# Patient Record
Sex: Female | Born: 1960 | Race: White | Hispanic: No | Smoking: Current some day smoker
Health system: Southern US, Community
[De-identification: ages and names within clinical notes are randomized; demographics above are authoritative.]

## PROBLEM LIST (undated history)

## (undated) DIAGNOSIS — R42 Dizziness and giddiness: Secondary | ICD-10-CM

## (undated) DIAGNOSIS — Z9889 Other specified postprocedural states: Secondary | ICD-10-CM

## (undated) DIAGNOSIS — G4730 Sleep apnea, unspecified: Secondary | ICD-10-CM

## (undated) DIAGNOSIS — T4145XA Adverse effect of unspecified anesthetic, initial encounter: Secondary | ICD-10-CM

## (undated) DIAGNOSIS — R55 Syncope and collapse: Secondary | ICD-10-CM

## (undated) DIAGNOSIS — R112 Nausea with vomiting, unspecified: Secondary | ICD-10-CM

## (undated) DIAGNOSIS — K219 Gastro-esophageal reflux disease without esophagitis: Secondary | ICD-10-CM

## (undated) DIAGNOSIS — T8859XA Other complications of anesthesia, initial encounter: Secondary | ICD-10-CM

## (undated) DIAGNOSIS — I499 Cardiac arrhythmia, unspecified: Secondary | ICD-10-CM

## (undated) DIAGNOSIS — K2270 Barrett's esophagus without dysplasia: Secondary | ICD-10-CM

## (undated) HISTORY — PX: HERNIA REPAIR: SHX51

## (undated) HISTORY — PX: TONSILLECTOMY: SUR1361

## (undated) HISTORY — PX: NISSEN FUNDOPLICATION: SHX2091

## (undated) HISTORY — PX: ABDOMINAL HYSTERECTOMY: SHX81

## (undated) HISTORY — PX: COLONOSCOPY: SHX174

---

## 2009-12-18 ENCOUNTER — Inpatient Hospital Stay: Payer: Self-pay | Admitting: Internal Medicine

## 2010-01-06 ENCOUNTER — Ambulatory Visit: Payer: Self-pay | Admitting: Gastroenterology

## 2011-01-21 IMAGING — CT CT ABD-PELV W/ CM
1 of 2 series · 15 of 32 positions shown, 19 images · non-contrast
Comparison: none

REASON FOR EXAM: generalized abdominal pain and nausea
COMMENTS:   May transport without cardiac monitor

[Series 2: 3mm soft tissue · axial · 0.71mm/px · z∈[-951,-534]mm · 15 of 153 slices shown, 19 images]
[im 7/153  soft-tissue]
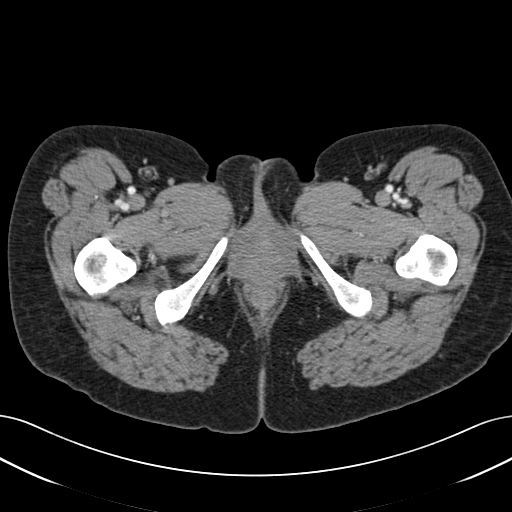
[im 7/153  bone]
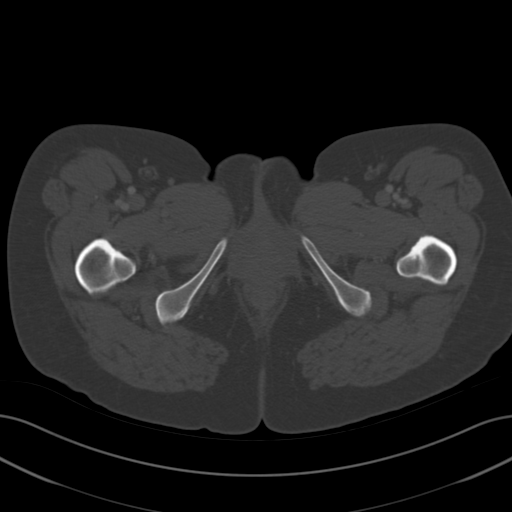
[im 19/153  soft-tissue]
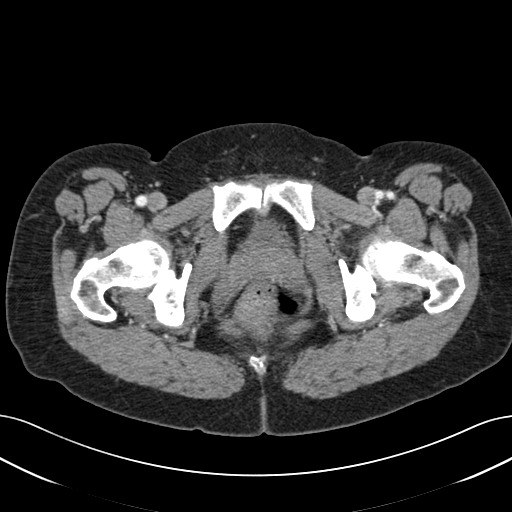
[im 31/153  soft-tissue]
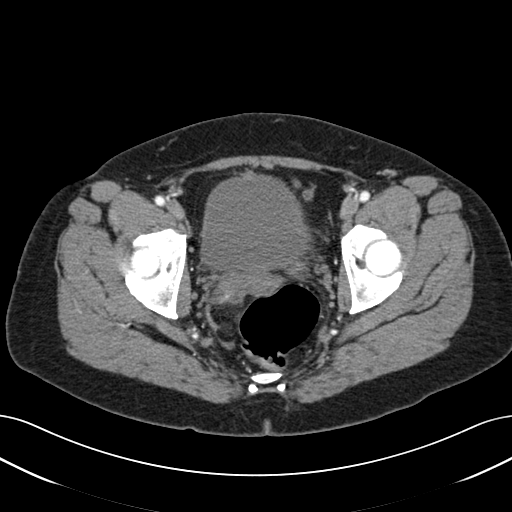
[im 43/153  soft-tissue]
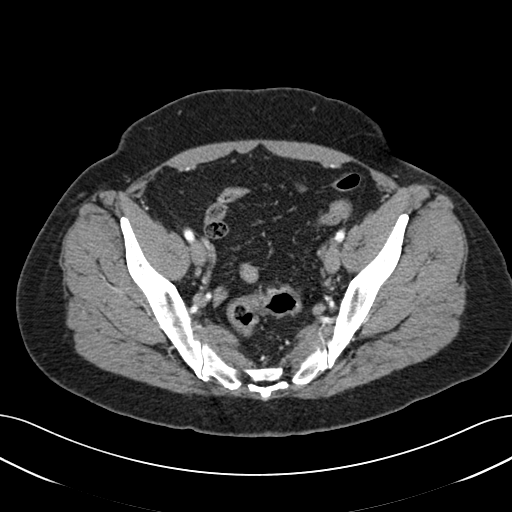
[im 55/153  soft-tissue]
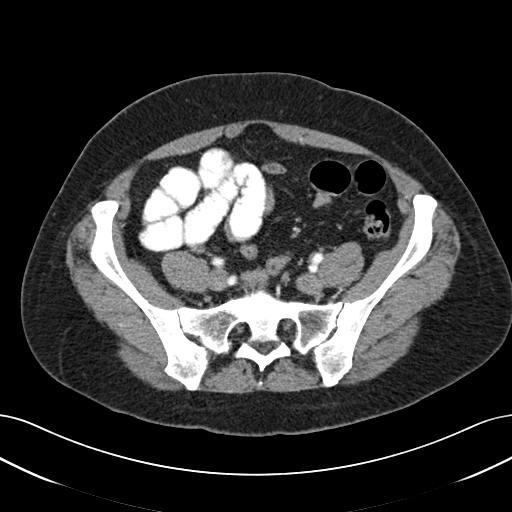
[im 67/153  soft-tissue]
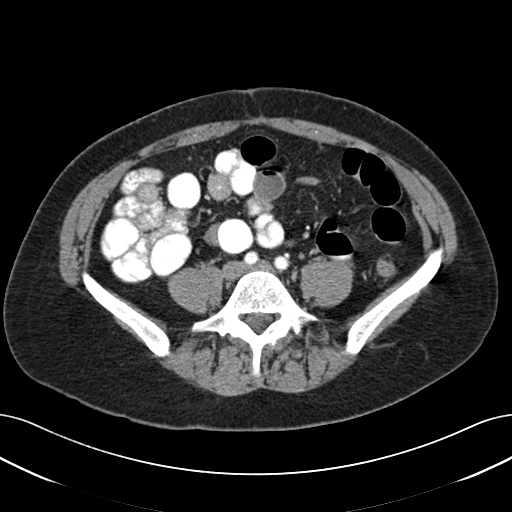
[im 80/153  soft-tissue]
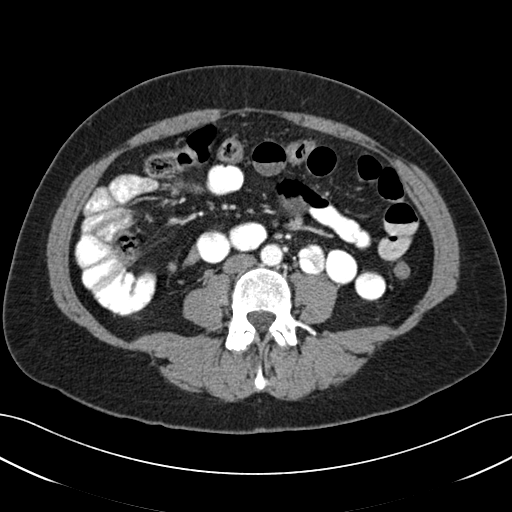
[im 86/153  soft-tissue]
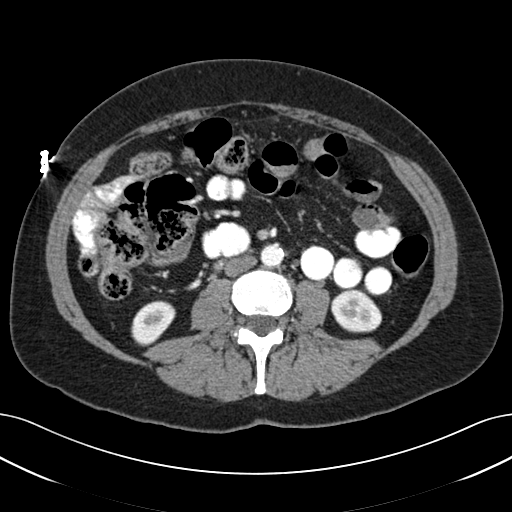
[im 98/153  soft-tissue]
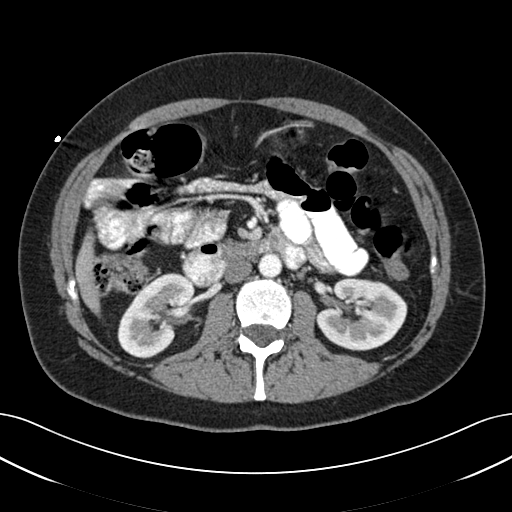
[im 98/153  bone]
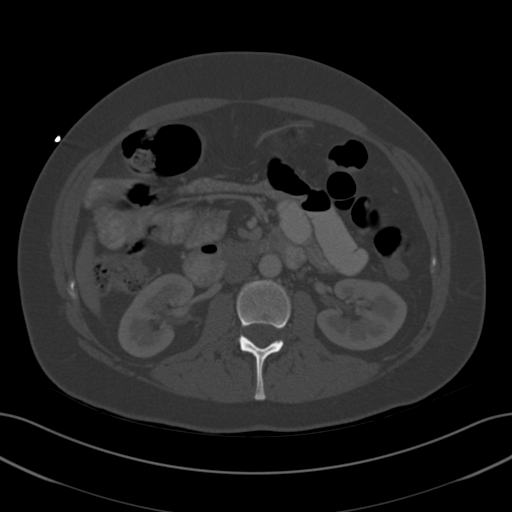
[im 110/153  soft-tissue]
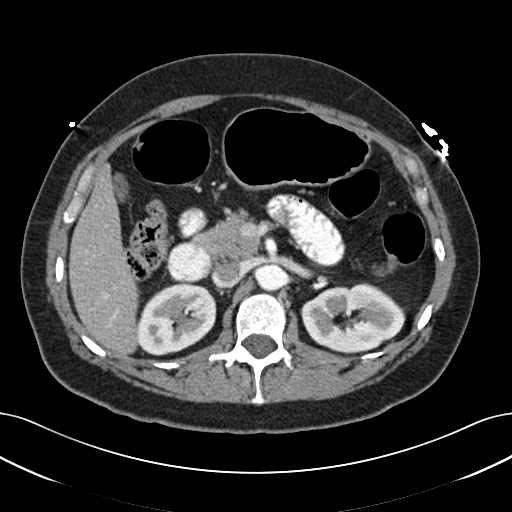
[im 122/153  soft-tissue]
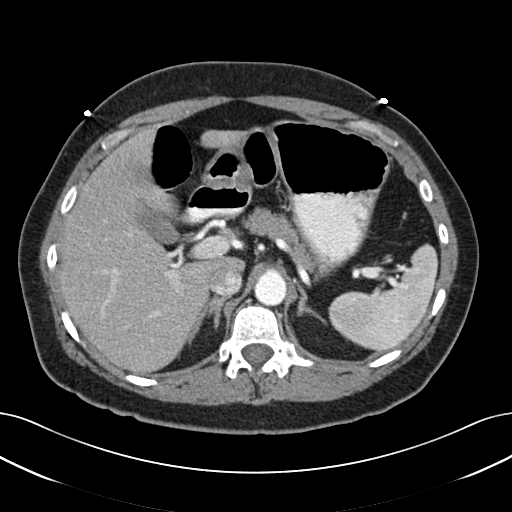
[im 128/153  lung]
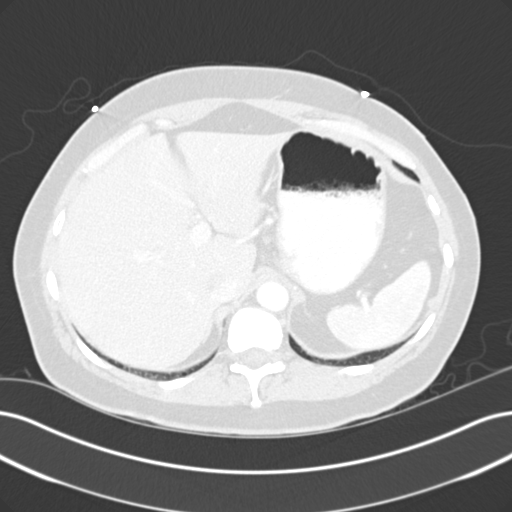
[im 134/153  soft-tissue]
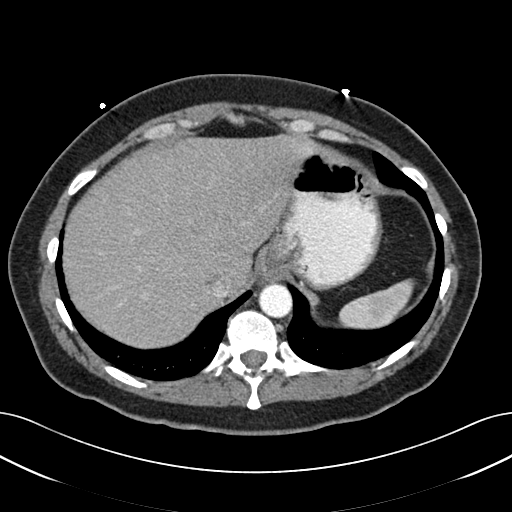
[im 134/153  lung]
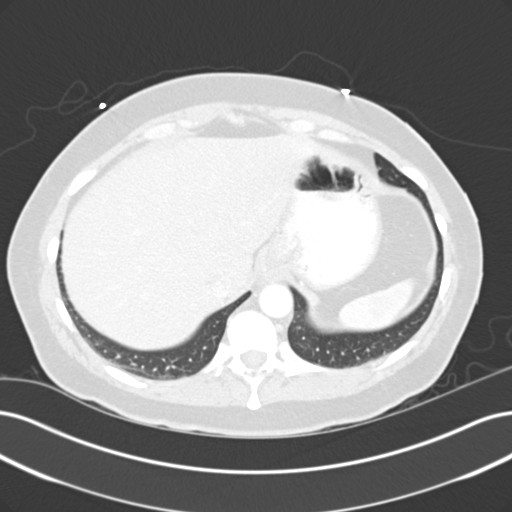
[im 140/153  lung]
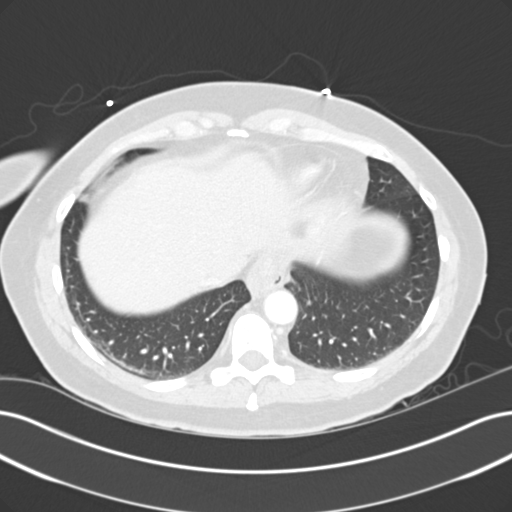
[im 146/153  soft-tissue]
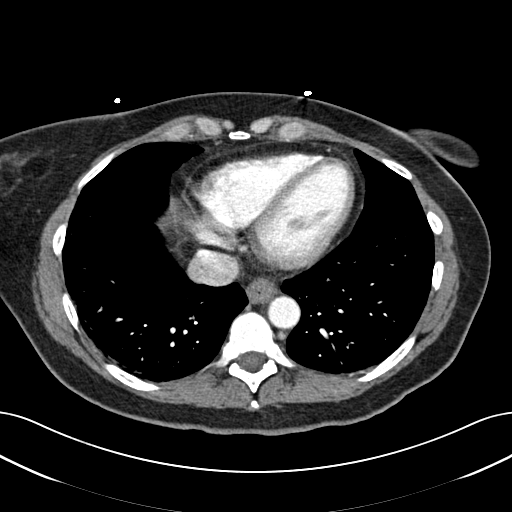
[im 146/153  lung]
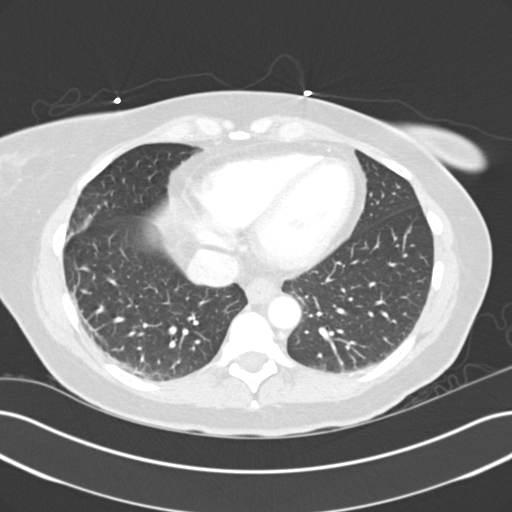

[15 of 32 positions shown; findings below may reference images not displayed]

PROCEDURE:     CT  - CT ABDOMEN / PELVIS  W  - December 18, 2009  [DATE]

RESULT:     Axial CT scanning was performed through the abdomen and pelvis
at 3 mm intervals and slice thicknesses following intravenous administration
of 100 cc of Psovue-FSK as well as oral contrast.

The liver exhibits normal density with no focal mass or ductal dilation. The
gallbladder is adequately distended with no evidence of stones or
inflammatory change in the surrounding fat. The spleen is normal in size.
The stomach is moderately distended. There is a hiatal hernia and there is
some thickening of the distal esophagus. There are no adrenal masses. The
pancreas, kidneys, and abdominal aorta appear normal. The periaortic and
pericaval regions are normal in appearance. The orally administered contrast
has traversed only a portion of the small bowel. The small bowel gas pattern
is nonspecific. The colon is relatively collapsed. The urinary bladder is
normal in appearance. The adnexal structures exhibit no acute abnormality.
There is an approximately 2 cm diameter ovarian cyst on the right. The lung
bases exhibit minimal atelectasis on the right. The lumbar vertebral bodies
are preserved in height.
IMPRESSION: 1. I do not see acute abnormality of the stomach. There is thickening of the
wall of the distal esophagus and there is a small hiatal hernia. Further
evaluation of the patient's history of Barrett's esophagus with upper
endoscopy may be of value.
2. I see no acute hepatobiliary abnormality nor acute urinary tract
abnormality.
3. The bowel gas pattern in the small bowel is nonspecific. The gas and
stool pattern within the colon is within the limits of normal. There is no
evidence of acute appendicitis.

A preliminary report was sent to the [HOSPITAL] the conclusion
of the study.

## 2016-10-12 ENCOUNTER — Encounter: Payer: Self-pay | Admitting: *Deleted

## 2016-10-18 ENCOUNTER — Ambulatory Visit: Payer: BC Managed Care – PPO | Admitting: Anesthesiology

## 2016-10-18 ENCOUNTER — Ambulatory Visit
Admission: RE | Admit: 2016-10-18 | Discharge: 2016-10-18 | Disposition: A | Discharge status: Home / Self Care | Payer: BC Managed Care – PPO | Source: Ambulatory Visit | Attending: Ophthalmology | Admitting: Ophthalmology

## 2016-10-18 ENCOUNTER — Encounter
Admission: RE | Disposition: A | Discharge status: Home / Self Care | Payer: Self-pay | Source: Ambulatory Visit | Attending: Ophthalmology

## 2016-10-18 DIAGNOSIS — G4730 Sleep apnea, unspecified: Secondary | ICD-10-CM | POA: Insufficient documentation

## 2016-10-18 DIAGNOSIS — H2511 Age-related nuclear cataract, right eye: Secondary | ICD-10-CM | POA: Insufficient documentation

## 2016-10-18 DIAGNOSIS — K219 Gastro-esophageal reflux disease without esophagitis: Secondary | ICD-10-CM | POA: Diagnosis not present

## 2016-10-18 HISTORY — DX: Other specified postprocedural states: Z98.890

## 2016-10-18 HISTORY — DX: Gastro-esophageal reflux disease without esophagitis: K21.9

## 2016-10-18 HISTORY — DX: Syncope and collapse: R55

## 2016-10-18 HISTORY — DX: Dizziness and giddiness: R42

## 2016-10-18 HISTORY — DX: Sleep apnea, unspecified: G47.30

## 2016-10-18 HISTORY — DX: Other complications of anesthesia, initial encounter: T88.59XA

## 2016-10-18 HISTORY — DX: Barrett's esophagus without dysplasia: K22.70

## 2016-10-18 HISTORY — DX: Cardiac arrhythmia, unspecified: I49.9

## 2016-10-18 HISTORY — DX: Adverse effect of unspecified anesthetic, initial encounter: T41.45XA

## 2016-10-18 HISTORY — DX: Nausea with vomiting, unspecified: R11.2

## 2016-10-18 HISTORY — PX: CATARACT EXTRACTION W/PHACO: SHX586

## 2016-10-18 SURGERY — PHACOEMULSIFICATION, CATARACT, WITH IOL INSERTION
Anesthesia: Monitor Anesthesia Care | Site: Eye | Laterality: Right | Wound class: Clean

## 2016-10-18 MED ORDER — FENTANYL CITRATE (PF) 100 MCG/2ML IJ SOLN
INTRAMUSCULAR | Status: AC
  Filled 2016-10-18: qty 2

## 2016-10-18 MED ORDER — CARBACHOL 0.01 % IO SOLN
INTRAOCULAR | Status: DC | PRN
  Administered 2016-10-18: .5 mL via INTRAOCULAR

## 2016-10-18 MED ORDER — EPINEPHRINE PF 1 MG/ML IJ SOLN
INTRAOCULAR | Status: DC | PRN
  Administered 2016-10-18: 1 mL via OPHTHALMIC

## 2016-10-18 MED ORDER — ARMC OPHTHALMIC DILATING DROPS
1.0000 "application " | OPHTHALMIC | Status: AC
  Administered 2016-10-18 (×3): 1 via OPHTHALMIC

## 2016-10-18 MED ORDER — MOXIFLOXACIN HCL 0.5 % OP SOLN
1.0000 [drp] | OPHTHALMIC | Status: AC
  Administered 2016-10-18 (×3): 1 [drp] via OPHTHALMIC

## 2016-10-18 MED ORDER — MIDAZOLAM HCL 2 MG/2ML IJ SOLN
INTRAMUSCULAR | Status: DC | PRN
  Administered 2016-10-18 (×2): 1 mg via INTRAVENOUS

## 2016-10-18 MED ORDER — MOXIFLOXACIN HCL 0.5 % OP SOLN
OPHTHALMIC | Status: DC | PRN
  Administered 2016-10-18: 1 [drp] via OPHTHALMIC

## 2016-10-18 MED ORDER — ARMC OPHTHALMIC DILATING DROPS: 1.0000 "application " | OPHTHALMIC | Status: AC

## 2016-10-18 MED ORDER — POVIDONE-IODINE 5 % OP SOLN
OPHTHALMIC | Status: AC
  Filled 2016-10-18: qty 30

## 2016-10-18 MED ORDER — MOXIFLOXACIN HCL 0.5 % OP SOLN
OPHTHALMIC | Status: AC
  Filled 2016-10-18: qty 3

## 2016-10-18 MED ORDER — SODIUM CHLORIDE 0.9 % IV SOLN
INTRAVENOUS | Status: DC
  Administered 2016-10-18: 09:00:00 via INTRAVENOUS

## 2016-10-18 MED ORDER — FENTANYL CITRATE (PF) 100 MCG/2ML IJ SOLN
INTRAMUSCULAR | Status: DC | PRN
  Administered 2016-10-18: 50 ug via INTRAVENOUS

## 2016-10-18 MED ORDER — LIDOCAINE HCL (PF) 4 % IJ SOLN
INTRAMUSCULAR | Status: AC
  Filled 2016-10-18: qty 5

## 2016-10-18 MED ORDER — MOXIFLOXACIN HCL 0.5 % OP SOLN: 1.0000 [drp] | OPHTHALMIC | Status: AC

## 2016-10-18 MED ORDER — LIDOCAINE HCL (PF) 4 % IJ SOLN
INTRAOCULAR | Status: DC | PRN
  Administered 2016-10-18: 2.25 mL via OPHTHALMIC

## 2016-10-18 MED ORDER — NA CHONDROIT SULF-NA HYALURON 40-17 MG/ML IO SOLN
INTRAOCULAR | Status: AC
  Filled 2016-10-18: qty 1

## 2016-10-18 MED ORDER — MIDAZOLAM HCL 2 MG/2ML IJ SOLN
INTRAMUSCULAR | Status: AC
  Filled 2016-10-18: qty 2

## 2016-10-18 MED ORDER — ARMC OPHTHALMIC DILATING DROPS
OPHTHALMIC | Status: AC
  Administered 2016-10-18: 1 via OPHTHALMIC
  Filled 2016-10-18: qty 0.4

## 2016-10-18 MED ORDER — EPINEPHRINE PF 1 MG/ML IJ SOLN
INTRAMUSCULAR | Status: AC
  Filled 2016-10-18: qty 2

## 2016-10-18 MED ORDER — NA CHONDROIT SULF-NA HYALURON 40-17 MG/ML IO SOLN
INTRAOCULAR | Status: DC | PRN
  Administered 2016-10-18: 1 mL via INTRAOCULAR

## 2016-10-18 SURGICAL SUPPLY — 21 items
CANNULA ANT/CHMB 27GA (MISCELLANEOUS) ×3 IMPLANT
CUP MEDICINE 2OZ PLAST GRAD ST (MISCELLANEOUS) ×3 IMPLANT
GLOVE BIO SURGEON STRL SZ8 (GLOVE) ×3 IMPLANT
GLOVE BIOGEL M 6.5 STRL (GLOVE) ×3 IMPLANT
GLOVE SURG LX 8.0 MICRO (GLOVE) ×2
GLOVE SURG LX STRL 8.0 MICRO (GLOVE) ×1 IMPLANT
GOWN STRL REUS W/ TWL LRG LVL3 (GOWN DISPOSABLE) ×2 IMPLANT
GOWN STRL REUS W/TWL LRG LVL3 (GOWN DISPOSABLE) ×4
LENS IOL TECNIS ITEC 18.0 (Intraocular Lens) ×3 IMPLANT
PACK CATARACT (MISCELLANEOUS) ×3 IMPLANT
PACK CATARACT BRASINGTON LX (MISCELLANEOUS) ×3 IMPLANT
PACK EYE AFTER SURG (MISCELLANEOUS) ×3 IMPLANT
SOL BSS BAG (MISCELLANEOUS) ×3
SOL PREP PVP 2OZ (MISCELLANEOUS) ×3
SOLUTION BSS BAG (MISCELLANEOUS) ×1 IMPLANT
SOLUTION PREP PVP 2OZ (MISCELLANEOUS) ×1 IMPLANT
SYR 3ML LL SCALE MARK (SYRINGE) ×3 IMPLANT
SYR 5ML LL (SYRINGE) ×3 IMPLANT
SYR TB 1ML 27GX1/2 LL (SYRINGE) ×3 IMPLANT
WATER STERILE IRR 250ML POUR (IV SOLUTION) ×3 IMPLANT
WIPE NON LINTING 3.25X3.25 (MISCELLANEOUS) ×3 IMPLANT

## 2016-10-18 NOTE — Discharge Instructions (Signed)
Eye Surgery Discharge Instructions  Expect mild scratchy sensation or mild soreness. DO NOT RUB YOUR EYE!  The day of surgery:  Minimal physical activity, but bed rest is not required  No reading, computer work, or close hand work  No bending, lifting, or straining.  May watch TV  For 24 hours:  No driving, legal decisions, or alcoholic beverages  Safety precautions  Eat anything you prefer: It is better to start with liquids, then soup then solid foods.  _____ Eye patch should be worn until postoperative exam tomorrow.  ____ Solar shield eyeglasses should be worn for comfort in the sunlight/patch while sleeping  Resume all regular medications including aspirin or Coumadin if these were discontinued prior to surgery. You may shower, bathe, shave, or wash your hair. Tylenol may be taken for mild discomfort.  Call your doctor if you experience significant pain, nausea, or vomiting, fever > 101 or other signs of infection. [phone] or [phone] Specific instructions:  Follow-up Information    PORFILIO,WILLIAM LOUIS, MD Follow up.   Specialty:  Ophthalmology Why:  January 17 at 10:00am Contact information: [address] [place] [address] [phone]

## 2016-10-18 NOTE — H&P (Signed)
All labs reviewed. Abnormal studies sent to patients PCP when indicated.  Previous H&P reviewed, patient examined, there are NO CHANGES.  Jaqualin Serpa LOUIS1/16/201810:19 AM

## 2016-10-18 NOTE — Op Note (Signed)
PREOPERATIVE DIAGNOSIS:  Nuclear sclerotic cataract of the right eye.   POSTOPERATIVE DIAGNOSIS:  nuclear sclerotic cataract right eye   OPERATIVE PROCEDURE: Procedure(s): CATARACT EXTRACTION PHACO AND INTRAOCULAR LENS PLACEMENT (IOC)   SURGEON:  Galen ManilaWilliam Shanique Aslinger, MD.   ANESTHESIA:  Anesthesiologist: Lenard SimmerAndrew Karenz, MD CRNA: Marlana SalvageSandra Jessup, CRNA  1.      Managed anesthesia care. 2.      0.281ml of Shugarcaine was instilled in the eye following the paracentesis.   COMPLICATIONS:  None.   TECHNIQUE:   Stop and chop   DESCRIPTION OF PROCEDURE:  The patient was examined and consented in the preoperative holding area where the aforementioned topical anesthesia was applied to the right eye and then brought back to the Operating Room where the right eye was prepped and draped in the usual sterile ophthalmic fashion and a lid speculum was placed. A paracentesis was created with the side port blade and the anterior chamber was filled with viscoelastic. A near clear corneal incision was performed with the steel keratome. A continuous curvilinear capsulorrhexis was performed with a cystotome followed by the capsulorrhexis forceps. Hydrodissection and hydrodelineation were carried out with BSS on a blunt cannula. The lens was removed in a stop and chop  technique and the remaining cortical material was removed with the irrigation-aspiration handpiece. The capsular bag was inflated with viscoelastic and the Technis ZCB00  lens was placed in the capsular bag without complication. The remaining viscoelastic was removed from the eye with the irrigation-aspiration handpiece. The wounds were hydrated. The anterior chamber was flushed with Miostat and the eye was inflated to physiologic pressure. 0.91ml of Vigamox was placed in the anterior chamber. The wounds were found to be water tight. The eye was dressed with Vigamox. The patient was given protective glasses to wear throughout the day and a shield with which to  sleep tonight. The patient was also given drops with which to begin a drop regimen today and will follow-up with me in one day.  Implant Name Type Inv. Item Serial No. Manufacturer Lot No. LRB No. Used  LENS IOL DIOP 18.0 - [id number] Intraocular Lens LENS IOL DIOP 18.0 [id number] AMO   Right 1   Procedure(s) with comments: CATARACT EXTRACTION PHACO AND INTRAOCULAR LENS PLACEMENT (IOC) (Right) - US 00:32 AP% 19.2 CDE 6.20 Fluid pack lot # [id number]  Electronically signed: Taurus Alamo LOUIS 10/18/2016 10:45 AM

## 2016-10-18 NOTE — Transfer of Care (Signed)
Immediate Anesthesia Transfer of Care Note  Patient: Olivia Briggs  Procedure(s) Performed: Procedure(s) with comments: CATARACT EXTRACTION PHACO AND INTRAOCULAR LENS PLACEMENT (IOC) (Right) - [place] 00:32 AP% 19.2 CDE 6.20 Fluid pack lot # [id number]  Patient Location: PACU  Anesthesia Type:MAC  Level of Consciousness: awake, alert  and oriented  Airway & Oxygen Therapy: Patient Spontanous Breathing  Post-op Assessment: Report given to RN  Post vital signs: Reviewed and stable  Last Vitals:  Vitals:   10/18/16 1043 10/18/16 1048  BP: 113/71 113/71  Pulse: (!) 57 (!) 58  Resp: 16 16  Temp: 36.1 C     Last Pain:  Vitals:   10/18/16 1048  TempSrc: Tympanic         Complications: No apparent anesthesia complications

## 2016-10-18 NOTE — Anesthesia Postprocedure Evaluation (Signed)
Anesthesia Post Note  Patient: Olivia Briggs  Procedure(s) Performed: Procedure(s) (LRB): CATARACT EXTRACTION PHACO AND INTRAOCULAR LENS PLACEMENT (IOC) (Right)  Patient location during evaluation: PACU Anesthesia Type: MAC Level of consciousness: awake and alert Pain management: pain level controlled Vital Signs Assessment: post-procedure vital signs reviewed and stable Respiratory status: spontaneous breathing and nonlabored ventilation Cardiovascular status: stable Postop Assessment: no signs of nausea or vomiting Anesthetic complications: no     Last Vitals:  Vitals:   10/18/16 1043 10/18/16 1048  BP: 113/71 113/71  Pulse: (!) 57 (!) 58  Resp: 16 16  Temp: 36.1 C     Last Pain:  Vitals:   10/18/16 1048  TempSrc: Tympanic                 Darlyne Russian

## 2016-10-18 NOTE — Anesthesia Preprocedure Evaluation (Signed)
Anesthesia Evaluation  Patient identified by MRN, date of birth, ID band Patient awake    Reviewed: Allergy & Precautions, H&P , NPO status , Patient's Chart, lab work & pertinent test results, reviewed documented beta blocker date and time   History of Anesthesia Complications (+) PONV and history of anesthetic complications  Airway Mallampati: I  TM Distance: >3 FB Neck ROM: full    Dental  (+) Missing, Teeth Intact   Pulmonary neg shortness of breath, sleep apnea and Continuous Positive Airway Pressure Ventilation , neg COPD, neg recent URI, Current Smoker,    Pulmonary exam normal breath sounds clear to auscultation       Cardiovascular Exercise Tolerance: Good (-) hypertension(-) angina(-) CAD, (-) Past MI, (-) Cardiac Stents and (-) CABG Normal cardiovascular exam+ dysrhythmias (-) Valvular Problems/Murmurs Rhythm:regular Rate:Normal     Neuro/Psych negative neurological ROS  negative psych ROS   GI/Hepatic Neg liver ROS, GERD  ,  Endo/Other  negative endocrine ROS  Renal/GU negative Renal ROS  negative genitourinary   Musculoskeletal   Abdominal   Peds  Hematology negative hematology ROS (+)   Anesthesia Other Findings Past Medical History: No date: Barrett's esophagus No date: Complication of anesthesia No date: Dizziness No date: Dysrhythmia No date: GERD (gastroesophageal reflux disease) No date: PONV (postoperative nausea and vomiting) No date: Sleep apnea     Comment:  CPAP No date: Syncope   Reproductive/Obstetrics negative OB ROS                             Anesthesia Physical Anesthesia Plan  ASA: II  Anesthesia Plan: MAC   Post-op Pain Management:    Induction:   Airway Management Planned:   Additional Equipment:   Intra-op Plan:   Post-operative Plan:   Informed Consent: I have reviewed the patients History and Physical, chart, labs and discussed the  procedure including the risks, benefits and alternatives for the proposed anesthesia with the patient or authorized representative who has indicated his/her understanding and acceptance.   Dental Advisory Given  Plan Discussed with: Anesthesiologist, CRNA and Surgeon  Anesthesia Plan Comments:         Anesthesia Quick Evaluation

## 2016-11-01 ENCOUNTER — Encounter: Payer: Self-pay | Admitting: *Deleted

## 2016-11-08 ENCOUNTER — Encounter: Payer: Self-pay | Admitting: *Deleted

## 2016-11-08 ENCOUNTER — Ambulatory Visit: Payer: BC Managed Care – PPO | Admitting: Anesthesiology

## 2016-11-08 ENCOUNTER — Encounter
Admission: RE | Disposition: A | Discharge status: Home / Self Care | Payer: Self-pay | Source: Ambulatory Visit | Attending: Ophthalmology

## 2016-11-08 ENCOUNTER — Ambulatory Visit
Admission: RE | Admit: 2016-11-08 | Discharge: 2016-11-08 | Disposition: A | Discharge status: Home / Self Care | Payer: BC Managed Care – PPO | Source: Ambulatory Visit | Attending: Ophthalmology | Admitting: Ophthalmology

## 2016-11-08 DIAGNOSIS — K219 Gastro-esophageal reflux disease without esophagitis: Secondary | ICD-10-CM | POA: Insufficient documentation

## 2016-11-08 DIAGNOSIS — E1136 Type 2 diabetes mellitus with diabetic cataract: Secondary | ICD-10-CM | POA: Insufficient documentation

## 2016-11-08 DIAGNOSIS — F17200 Nicotine dependence, unspecified, uncomplicated: Secondary | ICD-10-CM | POA: Diagnosis not present

## 2016-11-08 DIAGNOSIS — I10 Essential (primary) hypertension: Secondary | ICD-10-CM | POA: Diagnosis not present

## 2016-11-08 DIAGNOSIS — Z79899 Other long term (current) drug therapy: Secondary | ICD-10-CM | POA: Insufficient documentation

## 2016-11-08 DIAGNOSIS — G4730 Sleep apnea, unspecified: Secondary | ICD-10-CM | POA: Diagnosis not present

## 2016-11-08 HISTORY — PX: CATARACT EXTRACTION W/PHACO: SHX586

## 2016-11-08 SURGERY — PHACOEMULSIFICATION, CATARACT, WITH IOL INSERTION
Anesthesia: Monitor Anesthesia Care | Site: Eye | Laterality: Left | Wound class: Clean

## 2016-11-08 MED ORDER — NA CHONDROIT SULF-NA HYALURON 40-17 MG/ML IO SOLN
INTRAOCULAR | Status: AC
  Filled 2016-11-08: qty 1

## 2016-11-08 MED ORDER — FENTANYL CITRATE (PF) 100 MCG/2ML IJ SOLN
INTRAMUSCULAR | Status: DC | PRN
  Administered 2016-11-08: 50 ug via INTRAVENOUS

## 2016-11-08 MED ORDER — MOXIFLOXACIN HCL 0.5 % OP SOLN: 1.0000 [drp] | OPHTHALMIC | Status: DC | PRN

## 2016-11-08 MED ORDER — ARMC OPHTHALMIC DILATING DROPS
1.0000 "application " | OPHTHALMIC | Status: AC
  Administered 2016-11-08 (×3): 1 via OPHTHALMIC

## 2016-11-08 MED ORDER — MIDAZOLAM HCL 2 MG/2ML IJ SOLN
INTRAMUSCULAR | Status: AC
  Filled 2016-11-08: qty 2

## 2016-11-08 MED ORDER — NA CHONDROIT SULF-NA HYALURON 40-17 MG/ML IO SOLN
INTRAOCULAR | Status: DC | PRN
Start: 2016-11-08 — End: 2016-11-08
  Administered 2016-11-08: 1 mL via INTRAOCULAR

## 2016-11-08 MED ORDER — LIDOCAINE HCL (PF) 4 % IJ SOLN
INTRAMUSCULAR | Status: AC
  Filled 2016-11-08: qty 5

## 2016-11-08 MED ORDER — POVIDONE-IODINE 5 % OP SOLN
OPHTHALMIC | Status: AC
  Filled 2016-11-08: qty 30

## 2016-11-08 MED ORDER — ARMC OPHTHALMIC DILATING DROPS
OPHTHALMIC | Status: AC
  Filled 2016-11-08: qty 0.4

## 2016-11-08 MED ORDER — EPINEPHRINE PF 1 MG/ML IJ SOLN
INTRAMUSCULAR | Status: AC
  Filled 2016-11-08: qty 2

## 2016-11-08 MED ORDER — MOXIFLOXACIN HCL 0.5 % OP SOLN
OPHTHALMIC | Status: AC
  Filled 2016-11-08: qty 3

## 2016-11-08 MED ORDER — LIDOCAINE HCL (PF) 4 % IJ SOLN
INTRAMUSCULAR | Status: DC | PRN
  Administered 2016-11-08: 4 mL via OPHTHALMIC

## 2016-11-08 MED ORDER — FENTANYL CITRATE (PF) 100 MCG/2ML IJ SOLN
INTRAMUSCULAR | Status: AC
Start: 2016-11-08 — End: ?
  Filled 2016-11-08: qty 2

## 2016-11-08 MED ORDER — EPINEPHRINE PF 1 MG/ML IJ SOLN
INTRAOCULAR | Status: DC | PRN
  Administered 2016-11-08: 1 mL via OPHTHALMIC

## 2016-11-08 MED ORDER — POVIDONE-IODINE 5 % OP SOLN
OPHTHALMIC | Status: DC | PRN
  Administered 2016-11-08: 1 via OPHTHALMIC

## 2016-11-08 MED ORDER — MIDAZOLAM HCL 2 MG/2ML IJ SOLN
INTRAMUSCULAR | Status: DC | PRN
  Administered 2016-11-08 (×2): 1 mg via INTRAVENOUS

## 2016-11-08 MED ORDER — CARBACHOL 0.01 % IO SOLN
INTRAOCULAR | Status: DC | PRN
  Administered 2016-11-08: 0.5 mL via INTRAOCULAR

## 2016-11-08 MED ORDER — MOXIFLOXACIN HCL 0.5 % OP SOLN
OPHTHALMIC | Status: DC | PRN
  Administered 2016-11-08: 0.2 mL via OPHTHALMIC

## 2016-11-08 MED ORDER — SODIUM CHLORIDE 0.9 % IV SOLN
INTRAVENOUS | Status: DC
  Administered 2016-11-08: 07:00:00 via INTRAVENOUS

## 2016-11-08 SURGICAL SUPPLY — 21 items
CANNULA ANT/CHMB 27GA (MISCELLANEOUS) ×3 IMPLANT
CUP MEDICINE 2OZ PLAST GRAD ST (MISCELLANEOUS) ×3 IMPLANT
GLOVE BIO SURGEON STRL SZ8 (GLOVE) ×3 IMPLANT
GLOVE BIOGEL M 6.5 STRL (GLOVE) ×3 IMPLANT
GLOVE SURG LX 8.0 MICRO (GLOVE) ×2
GLOVE SURG LX STRL 8.0 MICRO (GLOVE) ×1 IMPLANT
GOWN STRL REUS W/ TWL LRG LVL3 (GOWN DISPOSABLE) ×2 IMPLANT
GOWN STRL REUS W/TWL LRG LVL3 (GOWN DISPOSABLE) ×4
LENS IOL TECNIS ITEC 17.5 (Intraocular Lens) ×3 IMPLANT
PACK CATARACT (MISCELLANEOUS) ×3 IMPLANT
PACK CATARACT BRASINGTON LX (MISCELLANEOUS) ×3 IMPLANT
PACK EYE AFTER SURG (MISCELLANEOUS) ×3 IMPLANT
SOL BSS BAG (MISCELLANEOUS) ×3
SOL PREP PVP 2OZ (MISCELLANEOUS) ×3
SOLUTION BSS BAG (MISCELLANEOUS) ×1 IMPLANT
SOLUTION PREP PVP 2OZ (MISCELLANEOUS) ×1 IMPLANT
SYR 3ML LL SCALE MARK (SYRINGE) ×3 IMPLANT
SYR 5ML LL (SYRINGE) ×3 IMPLANT
SYR TB 1ML 27GX1/2 LL (SYRINGE) ×3 IMPLANT
WATER STERILE IRR 250ML POUR (IV SOLUTION) ×3 IMPLANT
WIPE NON LINTING 3.25X3.25 (MISCELLANEOUS) ×3 IMPLANT

## 2016-11-08 NOTE — H&P (Signed)
All labs reviewed. Abnormal studies sent to patients PCP when indicated.  Previous H&P reviewed, patient examined, there are NO CHANGES.  Olivia Stainback LOUIS2/6/20188:09 AM

## 2016-11-08 NOTE — Anesthesia Postprocedure Evaluation (Signed)
Anesthesia Post Note  Patient: Olivia Briggs  Procedure(s) Performed: Procedure(s) (LRB): CATARACT EXTRACTION PHACO AND INTRAOCULAR LENS PLACEMENT (IOC) (Left)  Patient location during evaluation: PACU Anesthesia Type: MAC Level of consciousness: awake, awake and alert and oriented Pain management: pain level controlled Vital Signs Assessment: post-procedure vital signs reviewed and stable Respiratory status: spontaneous breathing Cardiovascular status: blood pressure returned to baseline Postop Assessment: no signs of nausea or vomiting Anesthetic complications: no     Last Vitals:  Vitals:   11/08/16 0835 11/08/16 0847  BP: 117/81 (!) 131/91  Pulse: (!) 57   Resp: 16   Temp:      Last Pain:  Vitals:   11/08/16 0703  TempSrc: Tympanic                 Kolton Kienle Lorenza Chick

## 2016-11-08 NOTE — Discharge Instructions (Signed)
Eye Surgery Discharge Instructions  Expect mild scratchy sensation or mild soreness. DO NOT RUB YOUR EYE!  The day of surgery:  Minimal physical activity, but bed rest is not required  No reading, computer work, or close hand work  No bending, lifting, or straining.  May watch TV  For 24 hours:  No driving, legal decisions, or alcoholic beverages  Safety precautions  Eat anything you prefer: It is better to start with liquids, then soup then solid foods.  _____ Eye patch should be worn until postoperative exam tomorrow.  ____ Solar shield eyeglasses should be worn for comfort in the sunlight/patch while sleeping  Resume all regular medications including aspirin or Coumadin if these were discontinued prior to surgery. You may shower, bathe, shave, or wash your hair. Tylenol may be taken for mild discomfort.  Call your doctor if you experience significant pain, nausea, or vomiting, fever > 101 or other signs of infection. [phone] or [phone] Specific instructions:  Follow-up Information    PORFILIO,WILLIAM LOUIS, MD Follow up.   Specialty:  Ophthalmology Why:  February 7 at 9:35am Contact information: [address] [place] [address] [phone]

## 2016-11-08 NOTE — Anesthesia Preprocedure Evaluation (Signed)
Anesthesia Evaluation  Patient identified by MRN, date of birth, ID band Patient awake    Reviewed: Allergy & Precautions, H&P , NPO status , Patient's Chart, lab work & pertinent test results, reviewed documented beta blocker date and time   History of Anesthesia Complications (+) PONV and history of anesthetic complications  Airway Mallampati: III  TM Distance: >3 FB Neck ROM: full    Dental no notable dental hx. (+) Teeth Intact   Pulmonary neg pulmonary ROS, sleep apnea and Continuous Positive Airway Pressure Ventilation , Current Smoker,    Pulmonary exam normal breath sounds clear to auscultation       Cardiovascular Exercise Tolerance: Good hypertension, negative cardio ROS  + dysrhythmias  Rhythm:regular Rate:Normal     Neuro/Psych negative neurological ROS  negative psych ROS   GI/Hepatic negative GI ROS, Neg liver ROS, GERD  Medicated,  Endo/Other  negative endocrine ROSdiabetes  Renal/GU      Musculoskeletal   Abdominal   Peds  Hematology negative hematology ROS (+)   Anesthesia Other Findings   Reproductive/Obstetrics negative OB ROS                             Anesthesia Physical Anesthesia Plan  ASA: III  Anesthesia Plan: MAC   Post-op Pain Management:    Induction:   Airway Management Planned:   Additional Equipment:   Intra-op Plan:   Post-operative Plan:   Informed Consent: I have reviewed the patients History and Physical, chart, labs and discussed the procedure including the risks, benefits and alternatives for the proposed anesthesia with the patient or authorized representative who has indicated his/her understanding and acceptance.     Plan Discussed with: CRNA  Anesthesia Plan Comments:         Anesthesia Quick Evaluation

## 2016-11-08 NOTE — Anesthesia Procedure Notes (Signed)
Procedure Name: MAC Date/Time: 11/08/2016 8:15 AM Performed by: Hedda Slade Pre-anesthesia Checklist: Patient identified, Emergency Drugs available, Suction available and Patient being monitored Patient Re-evaluated:Patient Re-evaluated prior to inductionOxygen Delivery Method: Nasal cannula

## 2016-11-08 NOTE — Anesthesia Post-op Follow-up Note (Cosign Needed)
Anesthesia QCDR form completed.        

## 2016-11-08 NOTE — Transfer of Care (Signed)
Immediate Anesthesia Transfer of Care Note  Patient: Olivia Briggs  Procedure(s) Performed: Procedure(s) with comments: CATARACT EXTRACTION PHACO AND INTRAOCULAR LENS PLACEMENT (IOC) (Left) - [place] 34.0 AP% 20.6 CDE 7.00 Fluid pack lot # [id number]  Patient Location: PACU and Short Stay  Anesthesia Type:MAC  Level of Consciousness: awake, alert  and oriented  Airway & Oxygen Therapy: Patient Spontanous Breathing  Post-op Assessment: Report given to RN and Post -op Vital signs reviewed and stable  Post vital signs: Reviewed and stable  Last Vitals:  Vitals:   11/08/16 0833 11/08/16 0835  BP: 117/81 117/81  Pulse: (!) 57 (!) 57  Resp: 20 16  Temp: 36.2 C     Last Pain:  Vitals:   11/08/16 0703  TempSrc: Tympanic         Complications: No apparent anesthesia complications

## 2016-11-08 NOTE — Op Note (Signed)
PREOPERATIVE DIAGNOSIS:  Nuclear sclerotic cataract of the left eye.   POSTOPERATIVE DIAGNOSIS:  Nuclear sclerotic cataract of the left eye.   OPERATIVE PROCEDURE: Procedure(s): CATARACT EXTRACTION PHACO AND INTRAOCULAR LENS PLACEMENT (IOC)   SURGEON:  Galen ManilaWilliam Alexandr Yaworski, MD.   ANESTHESIA:  Anesthesiologist: Yevette EdwardsJames G Adams, MD CRNA: Karoline Caldwelleana Starr, CRNA  1.      Managed anesthesia care. 2.     0.501ml of Shugarcaine was instilled following the paracentesis   COMPLICATIONS:  None.   TECHNIQUE:   Stop and chop   DESCRIPTION OF PROCEDURE:  The patient was examined and consented in the preoperative holding area where the aforementioned topical anesthesia was applied to the left eye and then brought back to the Operating Room where the left eye was prepped and draped in the usual sterile ophthalmic fashion and a lid speculum was placed. A paracentesis was created with the side port blade and the anterior chamber was filled with viscoelastic. A near clear corneal incision was performed with the steel keratome. A continuous curvilinear capsulorrhexis was performed with a cystotome followed by the capsulorrhexis forceps. Hydrodissection and hydrodelineation were carried out with BSS on a blunt cannula. The lens was removed in a stop and chop  technique and the remaining cortical material was removed with the irrigation-aspiration handpiece. The capsular bag was inflated with viscoelastic and the Technis [id number] lens was placed in the capsular bag without complication. The remaining viscoelastic was removed from the eye with the irrigation-aspiration handpiece. The wounds were hydrated. The anterior chamber was flushed with Miostat and the eye was inflated to physiologic pressure. 0.371ml Vigamox was placed in the anterior chamber. The wounds were found to be water tight. The eye was dressed with Vigamox. The patient was given protective glasses to wear throughout the day and a shield with which to sleep tonight.  The patient was also given drops with which to begin a drop regimen today and will follow-up with me in one day.  Implant Name Type Inv. Item Serial No. Manufacturer Lot No. LRB No. Used  LENS IOL DIOP 17.5 - [id number] Intraocular Lens LENS IOL DIOP 17.5 [id number] AMO   Left 1    Procedure(s) with comments: CATARACT EXTRACTION PHACO AND INTRAOCULAR LENS PLACEMENT (IOC) (Left) - US 34.0 AP% 20.6 CDE 7.00 Fluid pack lot # [id number]  Electronically signed: Asusena Sigley LOUIS 11/08/2016 8:33 AM
